# Patient Record
Sex: Female | Born: 1955 | Race: White | Hispanic: No | Marital: Married | Smoking: Never smoker
Health system: Southern US, Community
[De-identification: ages and names within clinical notes are randomized; demographics above are authoritative.]

## PROBLEM LIST (undated history)

## (undated) DIAGNOSIS — C801 Malignant (primary) neoplasm, unspecified: Secondary | ICD-10-CM

## (undated) DIAGNOSIS — Z803 Family history of malignant neoplasm of breast: Secondary | ICD-10-CM

## (undated) DIAGNOSIS — C50919 Malignant neoplasm of unspecified site of unspecified female breast: Secondary | ICD-10-CM

## (undated) DIAGNOSIS — Z853 Personal history of malignant neoplasm of breast: Secondary | ICD-10-CM

## (undated) HISTORY — DX: Malignant (primary) neoplasm, unspecified: C80.1

## (undated) HISTORY — DX: Family history of malignant neoplasm of breast: Z80.3

## (undated) HISTORY — DX: Malignant neoplasm of unspecified site of unspecified female breast: C50.919

## (undated) HISTORY — DX: Personal history of malignant neoplasm of breast: Z85.3

---

## 1992-12-26 DIAGNOSIS — C50919 Malignant neoplasm of unspecified site of unspecified female breast: Secondary | ICD-10-CM

## 1992-12-26 HISTORY — PX: MASTECTOMY, PARTIAL: SHX709

## 1992-12-26 HISTORY — DX: Malignant neoplasm of unspecified site of unspecified female breast: C50.919

## 2006-07-19 ENCOUNTER — Ambulatory Visit: Payer: Self-pay | Admitting: Internal Medicine

## 2006-07-19 ENCOUNTER — Ambulatory Visit (HOSPITAL_COMMUNITY): Admission: RE | Admit: 2006-07-19 | Discharge: 2006-07-19 | Payer: Self-pay | Admitting: Internal Medicine

## 2008-06-17 ENCOUNTER — Ambulatory Visit: Payer: Self-pay | Admitting: Nurse Practitioner

## 2010-06-14 ENCOUNTER — Ambulatory Visit: Payer: Self-pay | Admitting: Nurse Practitioner

## 2010-10-20 ENCOUNTER — Ambulatory Visit: Payer: Self-pay | Admitting: Nurse Practitioner

## 2010-10-21 ENCOUNTER — Ambulatory Visit: Payer: Self-pay | Admitting: General Surgery

## 2010-10-25 LAB — PATHOLOGY REPORT

## 2011-08-09 ENCOUNTER — Emergency Department: Payer: Self-pay | Admitting: Emergency Medicine

## 2012-09-02 IMAGING — US ABDOMEN ULTRASOUND LIMITED
1 series · 17 of 25 positions shown · non-contrast
Comparison: none

REASON FOR EXAM: RUQ Pain Vomiting
COMMENTS:

[Series 1: abdomen ultrasound limited · 17 of 70 slices shown]
[im 1/70]
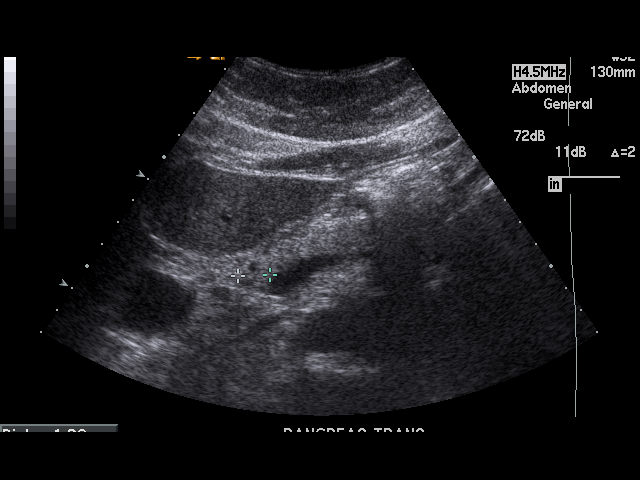
[im 6/70]
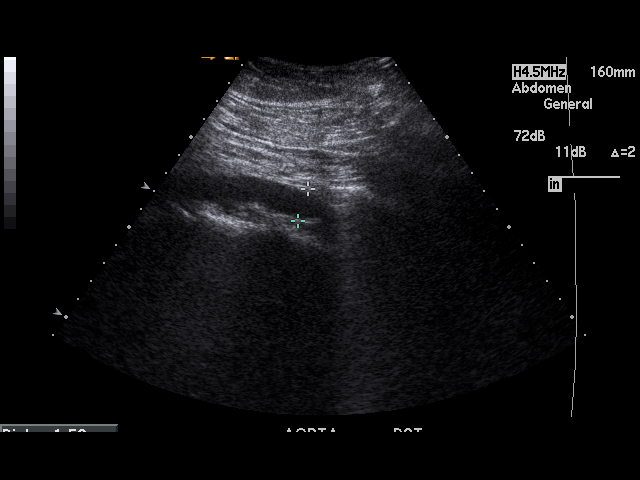
[im 9/70]
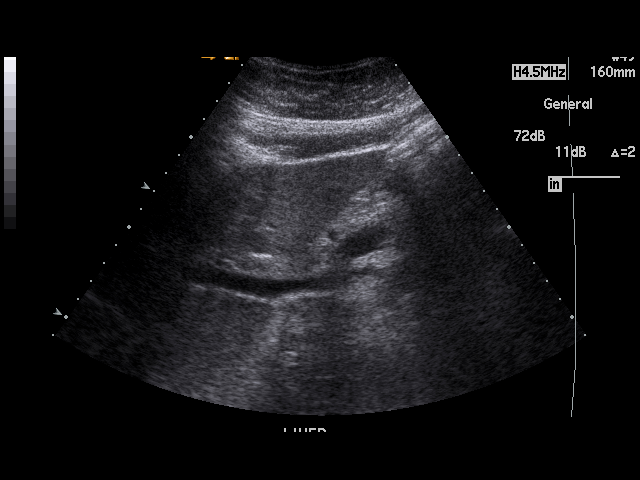
[im 15/70]
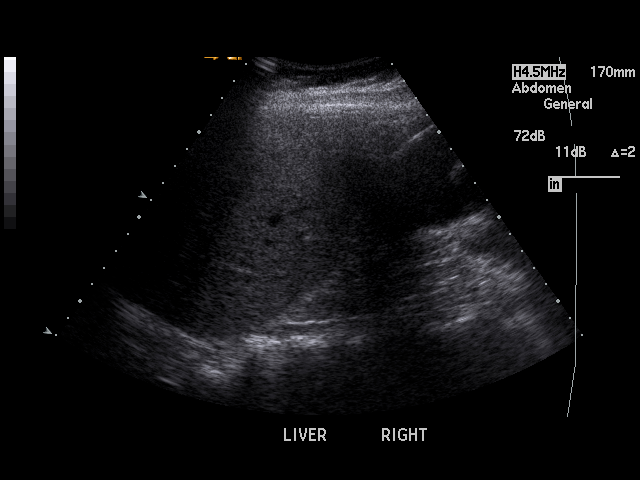
[im 18/70]
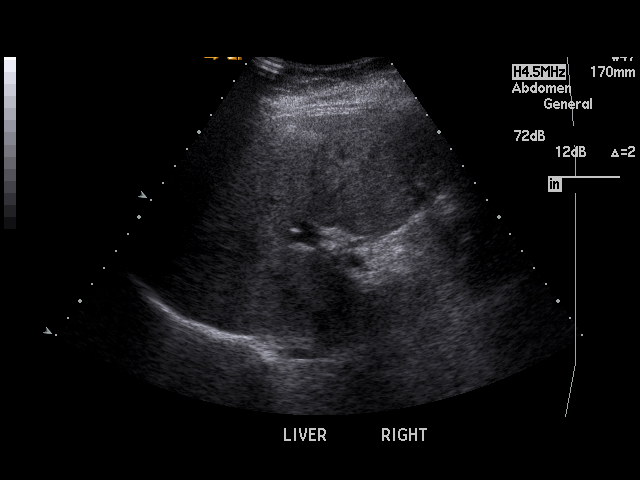
[im 24/70]
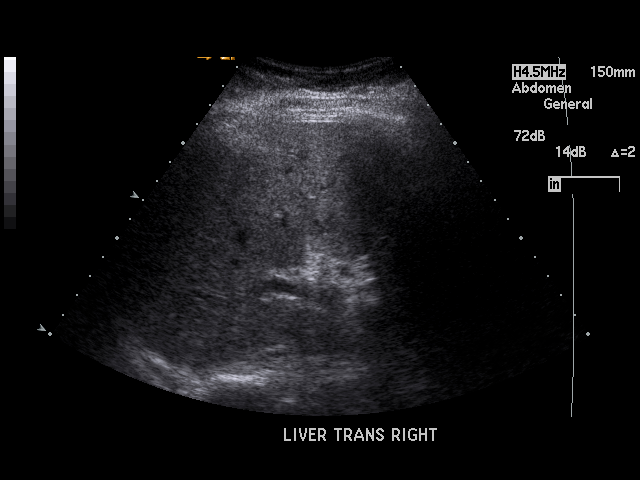
[im 26/70]
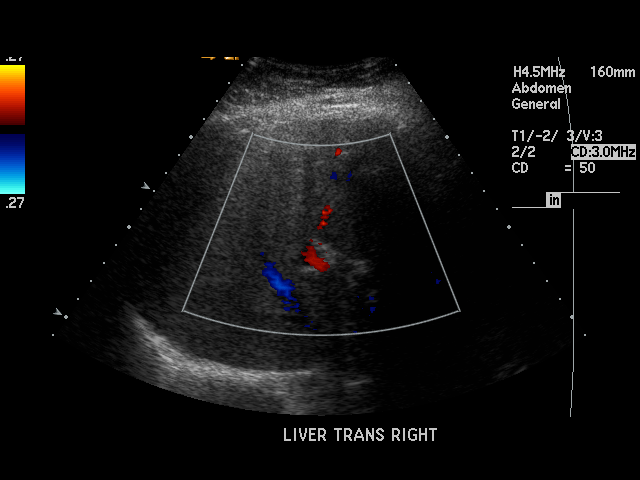
[im 32/70]
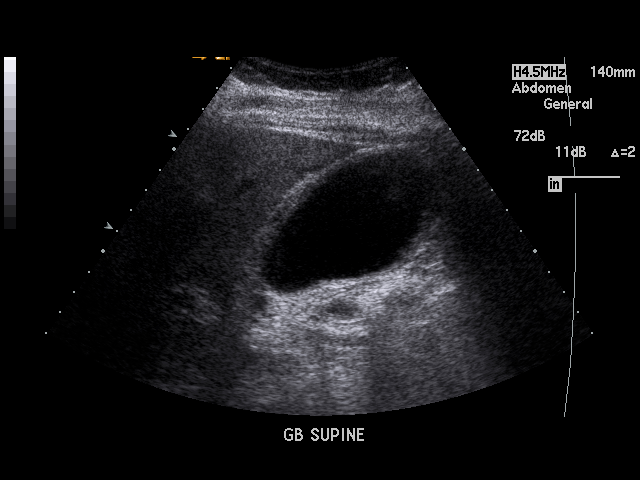
[im 35/70]
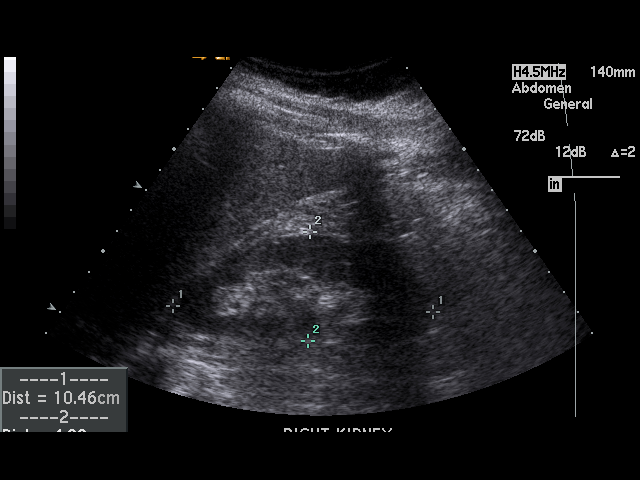
[im 38/70]
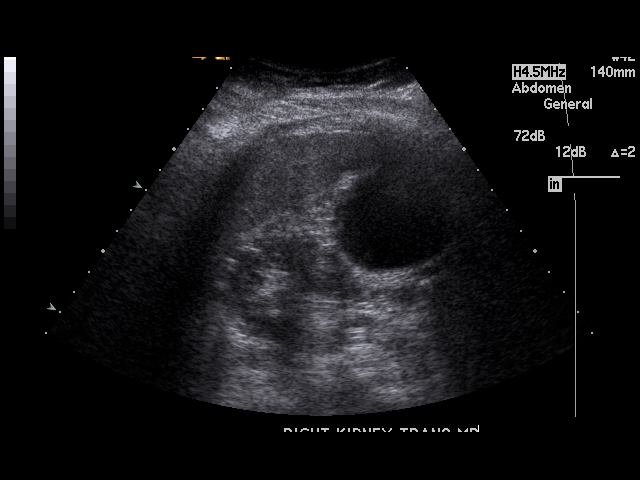
[im 44/70]
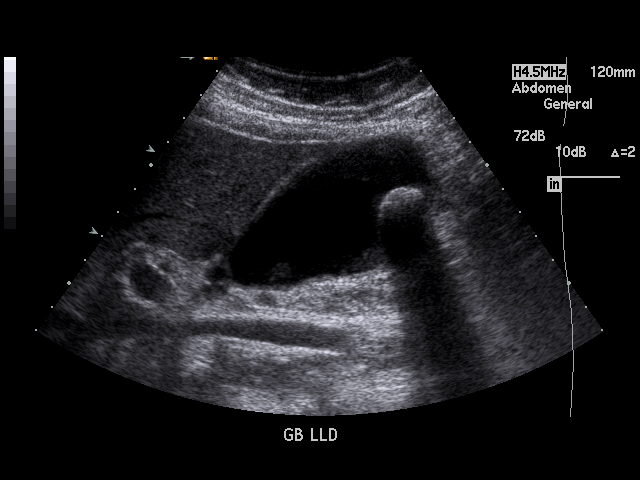
[im 47/70]
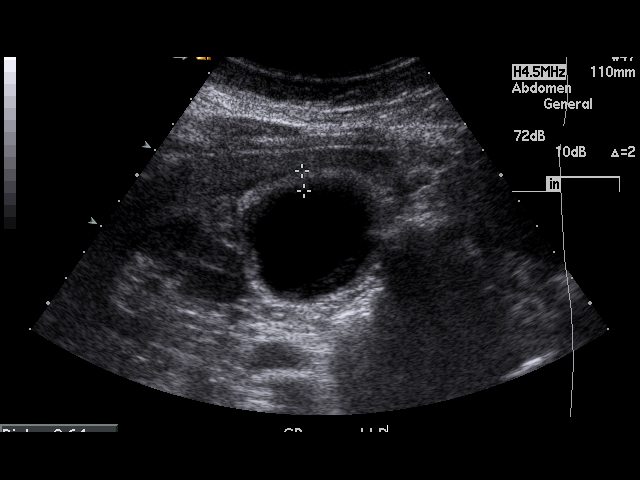
[im 52/70]
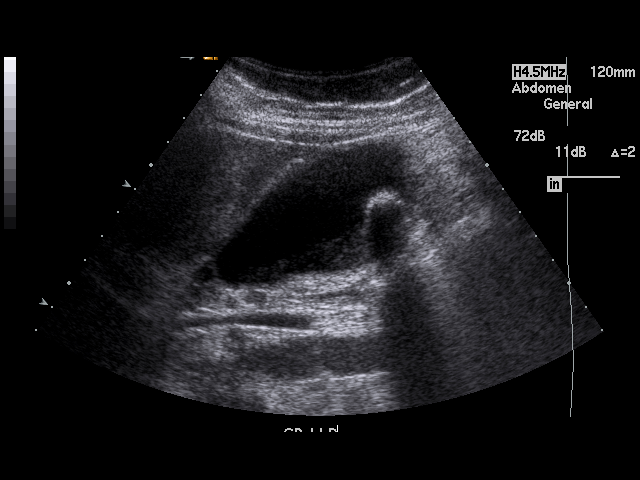
[im 55/70]
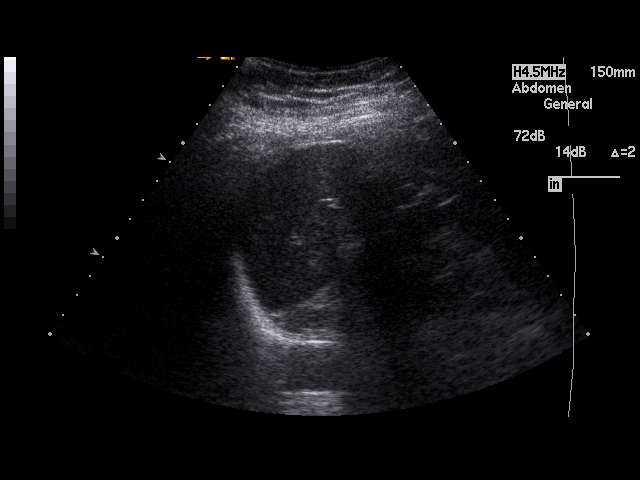
[im 61/70]
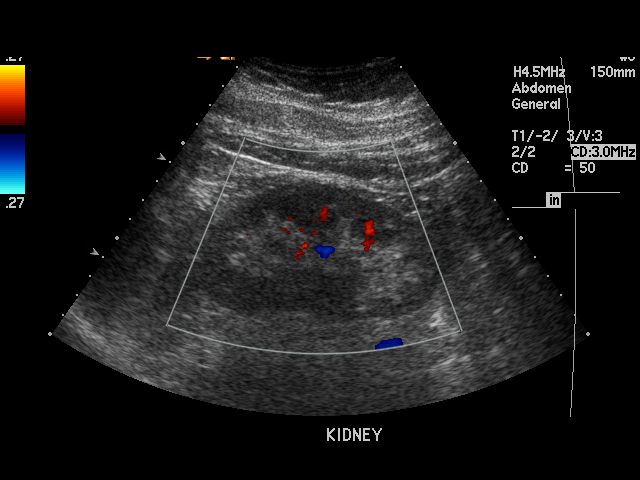
[im 64/70]
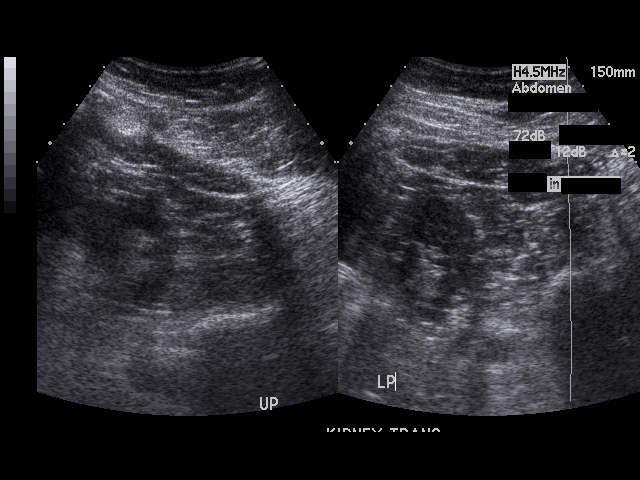
[im 70/70]
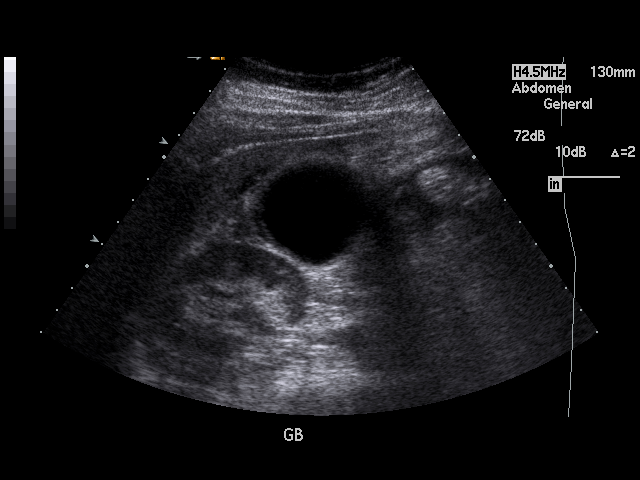

[17 of 25 positions shown; findings below may reference images not displayed]

PROCEDURE:     LASHUN - LASHUN ABDOMEN UPPER GENERAL  - October 20, 2010  [DATE]

RESULT:     The liver, spleen, abdominal aorta and inferior vena cava show
no significant abnormalities. A portion of the pancreatic tail is obscured
by bowel gas but otherwise the pancreas is normal in appearance. There is
observed a 2.2 cm, mobile echodensity in the gallbladder compatible with a
gallstone. Echogenic material compatible with sludge is also observed in the
gallbladder. The gallbladder wall is thickened and is noted to measure
mm in thickness. A small amount of pericholecystic fluid is also seen. These
changes suggest cholecystitis. The common bile duct measures 6.5 mm in
diameter which is upper limits for normal. The kidneys show no
hydronephrosis. No ascites is seen.
IMPRESSION: There is observed cholelithiasis with associated gallbladder
sludge, thickening of the gallbladder wall and pericholecystic fluid. The
findings are suspicious for cholecystitis.

## 2013-06-22 IMAGING — CT CT HEAD WITHOUT CONTRAST
2 series · 16 of 30 positions shown, 20 images · non-contrast
Comparison: none

REASON FOR EXAM: headache
COMMENTS:

[Series 2: without · axial · non-contrast · 0.38mm/px · z∈[+384,+504]mm · 13 of 30 slices shown, 17 images]
[im 3/30  brain]
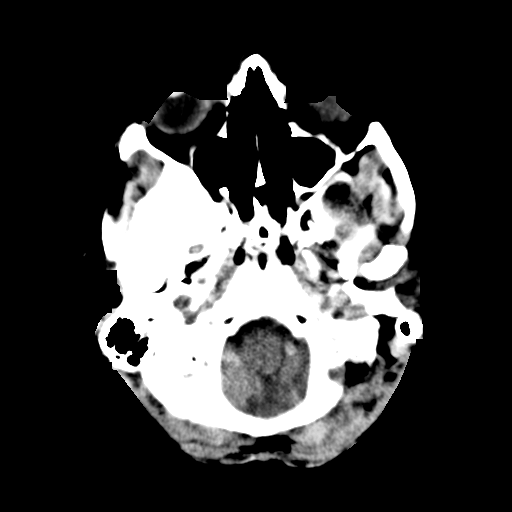
[im 3/30  bone]
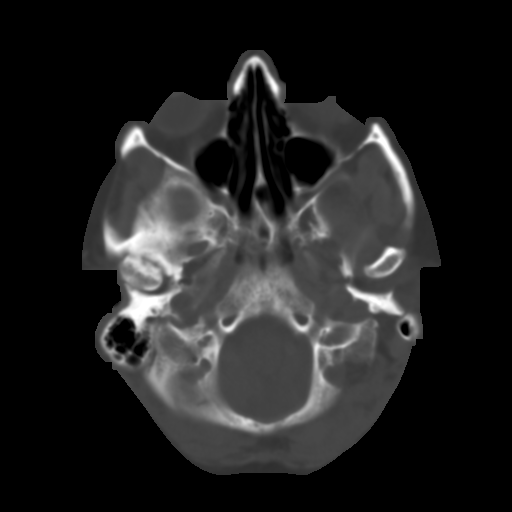
[im 5/30  brain]
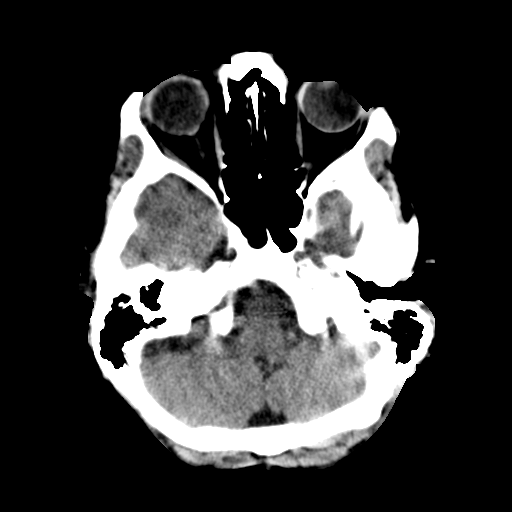
[im 7/30  brain]
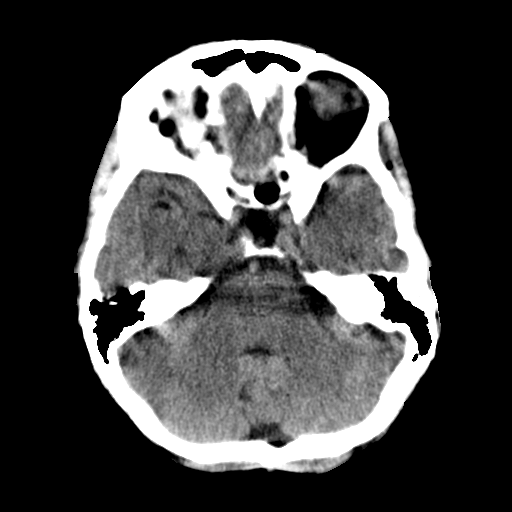
[im 9/30  brain]
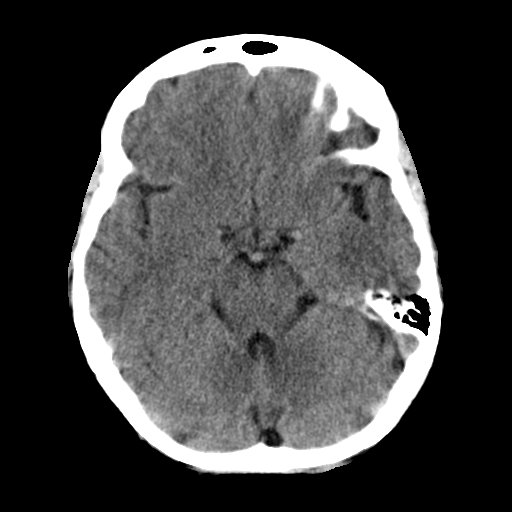
[im 11/30  brain]
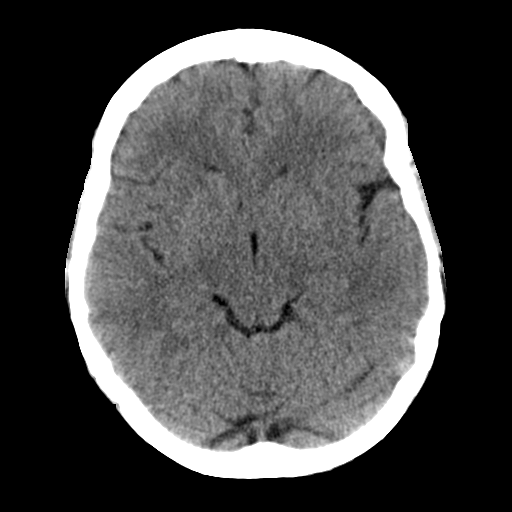
[im 11/30  bone]
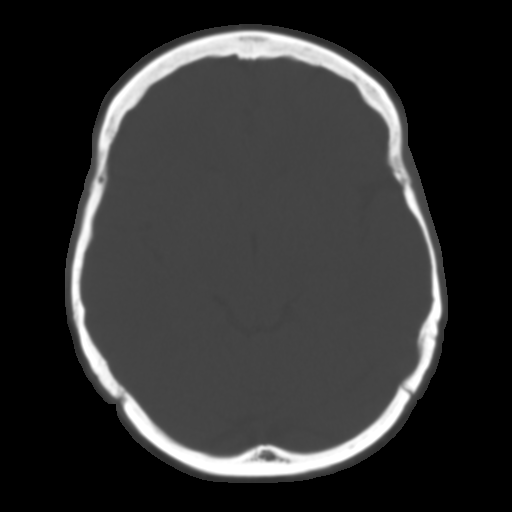
[im 13/30  brain]
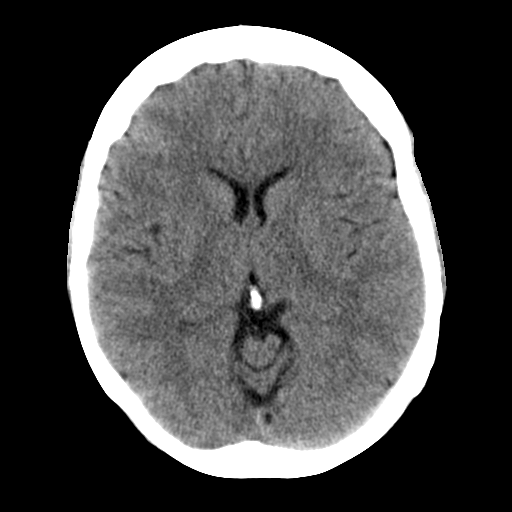
[im 15/30  brain]
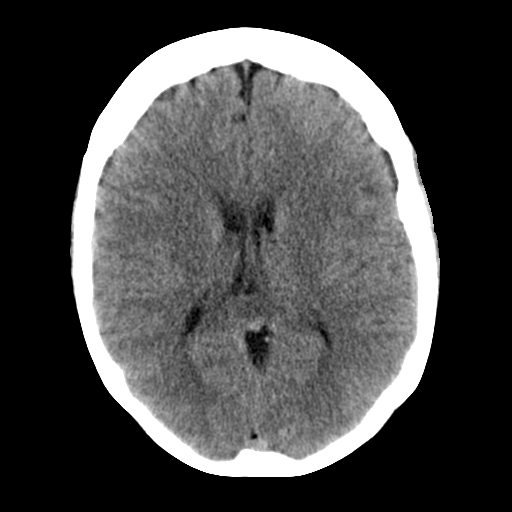
[im 17/30  brain]
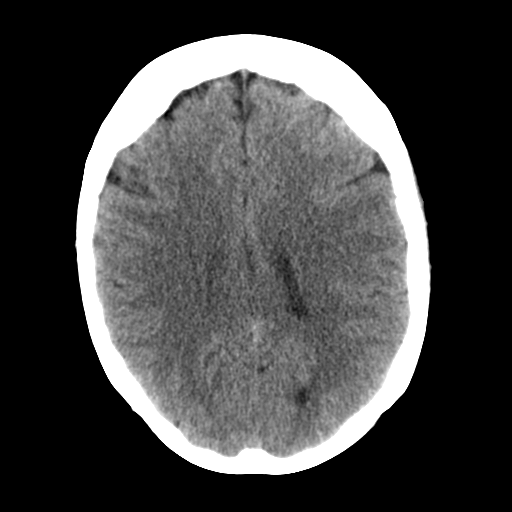
[im 19/30  brain]
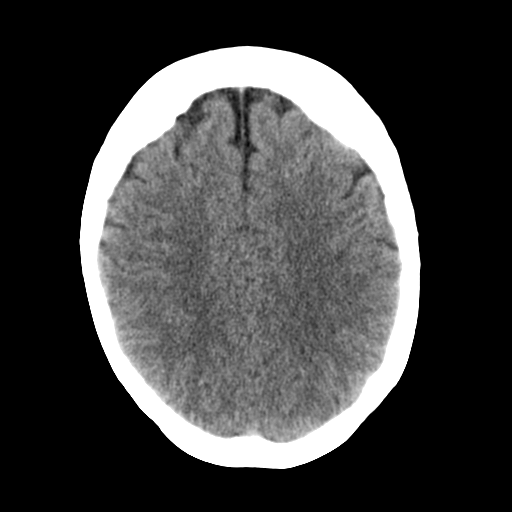
[im 19/30  bone]
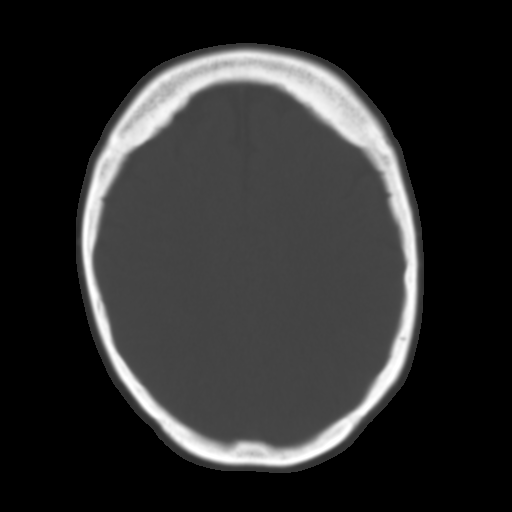
[im 21/30  brain]
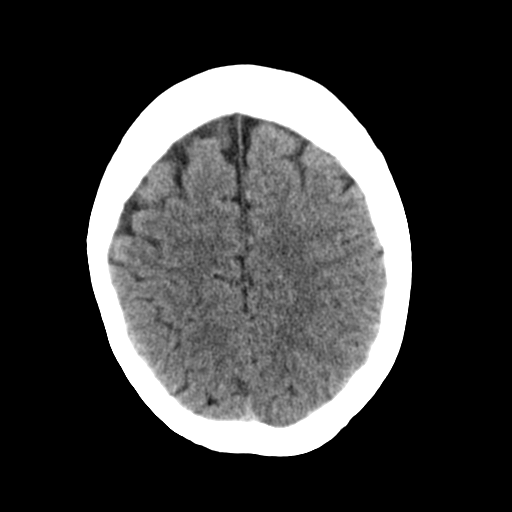
[im 23/30  brain]
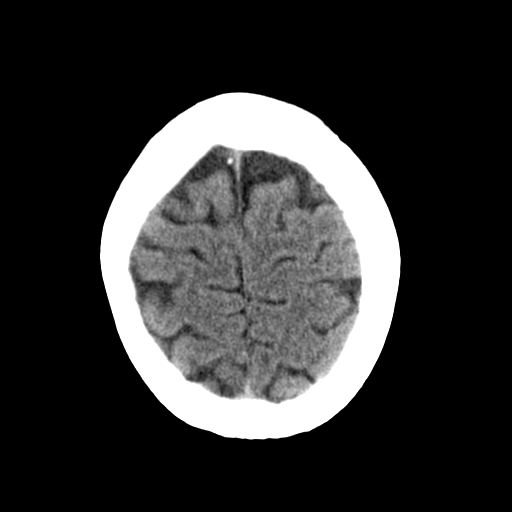
[im 25/30  brain]
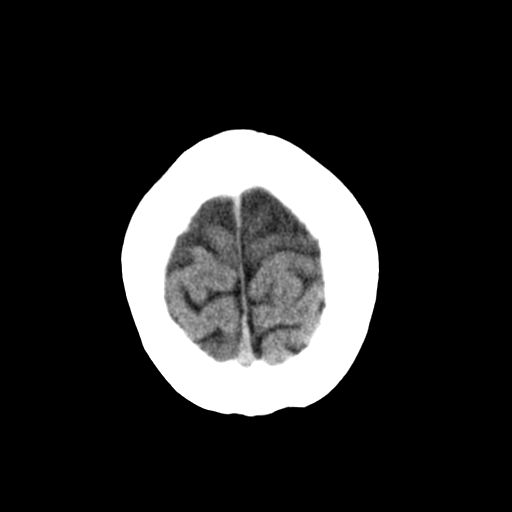
[im 27/30  brain]
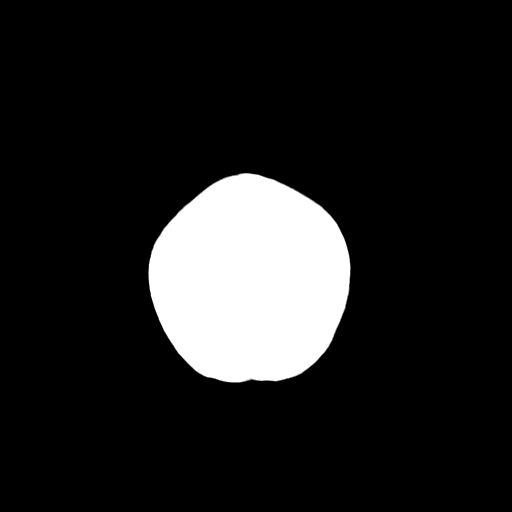
[im 27/30  bone]
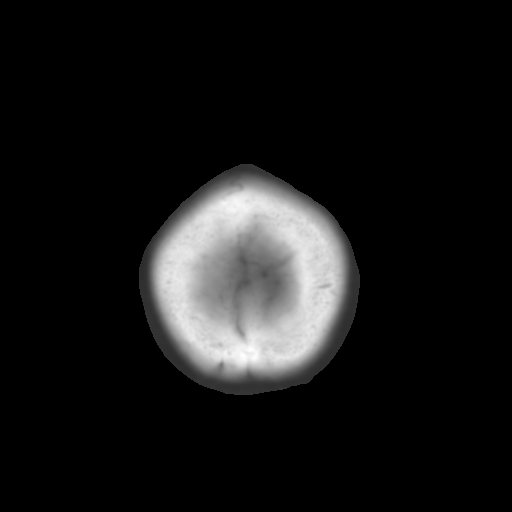

[Series 3: bone · axial · 0.38mm/px · z∈[+384,+424]mm · 3 of 30 slices shown]
[im 3/30  bone]
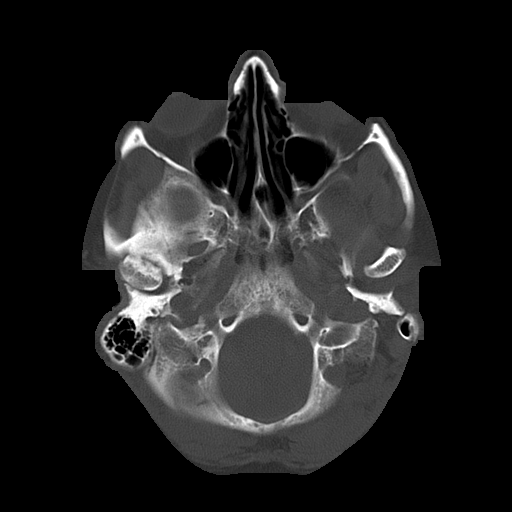
[im 7/30  bone]
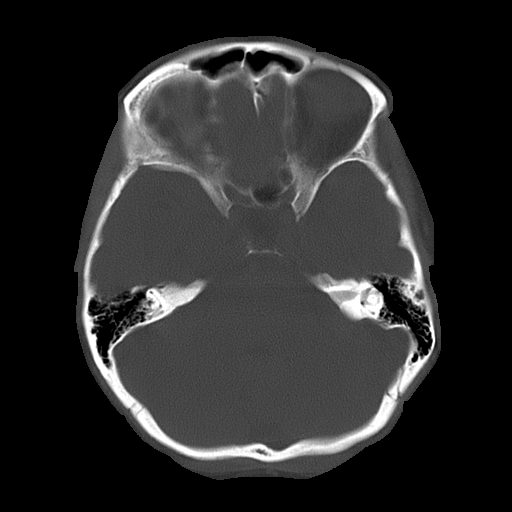
[im 11/30  bone]
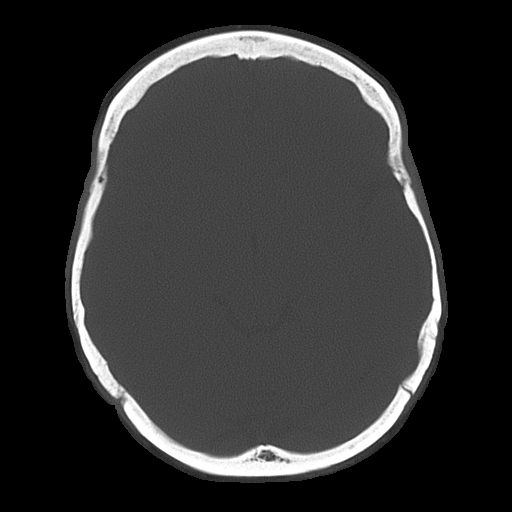

[16 of 30 positions shown; findings below may reference images not displayed]

PROCEDURE:     CT  - CT HEAD WITHOUT CONTRAST  - August 09, 2011 [DATE]

RESULT:     Emergent noncontrast CT of the brain is performed. The patient
has no previous exam for comparison.

The ventricles and sulci are normal. There is no hemorrhage. There is no
focal mass, mass-effect or midline shift. There is no evidence of edema or
territorial infarct. The bone windows demonstrate normal aeration of the
paranasal sinuses and mastoid air cells. There is no skull fracture
demonstrated.
IMPRESSION: 1. No acute intracranial abnormality.

## 2016-06-01 ENCOUNTER — Telehealth: Payer: Self-pay

## 2016-06-01 NOTE — Telephone Encounter (Signed)
RECALL FOR TCS °

## 2016-06-01 NOTE — Telephone Encounter (Signed)
Letter sent to pt

## 2016-08-04 ENCOUNTER — Encounter (HOSPITAL_COMMUNITY): Payer: BC Managed Care – PPO | Attending: Genetic Counselor | Admitting: Genetic Counselor

## 2016-08-04 ENCOUNTER — Encounter (HOSPITAL_COMMUNITY): Payer: Self-pay | Admitting: Genetic Counselor

## 2016-08-04 DIAGNOSIS — Z85850 Personal history of malignant neoplasm of thyroid: Secondary | ICD-10-CM

## 2016-08-04 DIAGNOSIS — Z315 Encounter for genetic counseling: Secondary | ICD-10-CM

## 2016-08-04 DIAGNOSIS — Z853 Personal history of malignant neoplasm of breast: Secondary | ICD-10-CM

## 2016-08-04 DIAGNOSIS — Z803 Family history of malignant neoplasm of breast: Secondary | ICD-10-CM | POA: Diagnosis not present

## 2016-08-04 DIAGNOSIS — C50912 Malignant neoplasm of unspecified site of left female breast: Secondary | ICD-10-CM

## 2016-08-04 DIAGNOSIS — C801 Malignant (primary) neoplasm, unspecified: Secondary | ICD-10-CM | POA: Insufficient documentation

## 2016-08-04 NOTE — Progress Notes (Signed)
REFERRING PROVIDER: Izola Price, MD  Kurtis Bushman, MD  PRIMARY PROVIDER:  No primary care provider on file.  PRIMARY REASON FOR VISIT:  1. Malignant neoplasm of left female breast, unspecified site of breast ([facility])   2. History of breast cancer   3. Family history of breast cancer   4. Cancer [facility])      HISTORY OF PRESENT ILLNESS:   Christine Peck, a 60 y.o. female, was seen for a [facility] cancer genetics consultation at the request of Dr. Lunette Stands due to a personal and family history of cancer.  Christine Peck presents to clinic today to discuss the possibility of a hereditary predisposition to cancer, genetic testing, and to further clarify her future cancer risks, as well as potential cancer risks for family members.   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 This was treated with chemotherapy and a partial mastectomy.  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  Her daughter was seen in our clinic in July enquiring about genetic testing.  We discussed that testing individuals who have had cancer is more informative then testing unaffected individuals.  Therefore testing Christine Peck would be most informative.      CANCER HISTORY:   No history exists.     HORMONAL RISK FACTORS:  Menarche was at age 60.  First live birth at age 19.  OCP use for approximately <1 years.  Ovaries intact: yes.  Hysterectomy: no.  Menopausal status: postmenopausal.  HRT use: 0 years. Colonoscopy: yes; normal. Mammogram within the last year: yes. Number of breast biopsies: 1. Up to date with pelvic exams:  yes. Any excessive radiation exposure in the past:  no  Past Medical History:  Diagnosis Date  . Breast cancer ([facility]) 1994   ER-/PR- breast cancer  . Cancer ([facility])    papillary thyroid cancer  . Family history of breast cancer   . History of breast cancer     Past Surgical History:  Procedure Laterality Date  .  MASTECTOMY, PARTIAL Left 1994    Social History   Social History  . Marital status: Married    Spouse name: N/A  . Number of children: N/A  . Years of education: N/A   Social History Main Topics  . Smoking status: Never Smoker  . Smokeless tobacco: Never Used  . Alcohol use No  . Drug use: No  . Sexual activity: Not Asked   Other Topics Concern  . None   Social History Narrative  . None     FAMILY HISTORY:  We obtained a detailed, 4-generation family history.  Significant diagnoses are listed below: Family History  Problem Relation Age of Onset  . ALS Mother   . Heart failure Father   . Bone cancer Maternal Aunt 78  . Breast cancer Paternal Aunt     dx >50  . Breast cancer Sister 66  . Colon cancer Paternal Aunt     The patient has two daughters who are cancer free.  She has two sisters, one who had breast cancer at age 28.  The patient's parents are both deceased.  Her mother died of ALS.  Her mother had three brothers and a sister.  The sister had bone cancer at 33.  The patient's father died of heart failure.  He had four sisters and five brothers.  One sister had breast cancer over 49 and another sister had colon cancer.  There is no other reported family history of cancer.  Patient's maternal ancestors are of Caucasian descent, and paternal ancestors are of Caucasian descent. There is no reported Ashkenazi Jewish ancestry. There is no known consanguinity.  GENETIC COUNSELING ASSESSMENT: Christine Peck is a 60 y.o. female with a personal and family history of cancer which is somewhat suggestive of a hereditary cancer syndrome and predisposition to cancer. We, therefore, discussed and recommended the following at today's visit.   DISCUSSION: We discussed that about 5-10% of breast cancer is due to hereditary causes, most commonly BRCA mutations.  There are other genes that can also be associated with breast cancer including PALB2, ATM and CHEK2.  We reviewed the  characteristics, features and inheritance patterns of hereditary cancer syndromes. We also discussed genetic testing, including the appropriate family members to test, the process of testing, insurance coverage and turn-around-time for results. We discussed the implications of a negative, positive and/or variant of uncertain significant result. We recommended Christine Peck pursue genetic testing for the Breast/Ovarian cancer gene panel.   Based on Christine Peck's personal and family history of cancer, she meets medical criteria for genetic testing. Despite that she meets criteria, she may still have an out of pocket cost. We discussed that if her out of pocket cost for testing is over $100, the laboratory will call and confirm whether she wants to proceed with testing.  If the out of pocket cost of testing is less than $100 she will be billed by the genetic testing laboratory.   PLAN: After considering the risks, benefits, and limitations, Christine Peck  provided informed consent to pursue genetic testing and the blood sample was sent to [facility] for analysis of the Breast/Ovarian cancer panel. Results should be available within approximately 2-3 weeks' time, at which point they will be disclosed by telephone to Christine Peck, as will any additional recommendations warranted by these results. Christine Peck will receive a summary of her genetic counseling visit and a copy of her results once available. This information will also be available in Epic. We encouraged Christine Peck to remain in contact with cancer genetics annually so that we can continuously update the family history and inform her of any changes in cancer genetics and testing that may be of benefit for her family. Christine Peck questions were answered to her satisfaction today. Our contact information was provided should additional questions or concerns arise.  Lastly, we encouraged Christine Peck to remain in contact with cancer genetics annually so that we can  continuously update the family history and inform her of any changes in cancer genetics and testing that may be of benefit for this family.   Ms.  Peck questions were answered to her satisfaction today. Our contact information was provided should additional questions or concerns arise. Thank you for the referral and allowing [place] to share in the care of your patient.   Naoko Diperna P. Florene Glen, [place], [facility] Certified Genetic Counselor [email or web address] phone: [phone]  The patient was seen for a total of 45 minutes in face-to-face genetic counseling.  This patient was discussed with Drs. Magrinat, Lindi Adie and/or Burr Medico who agrees with the above.    _______________________________________________________________________ For Office Staff:  Number of people involved in session: 2 Was an Intern/ student involved with case: no

## 2016-08-19 ENCOUNTER — Telehealth: Payer: Self-pay | Admitting: Genetic Counselor

## 2016-08-19 ENCOUNTER — Encounter: Payer: Self-pay | Admitting: Genetic Counselor

## 2016-08-19 DIAGNOSIS — Z1379 Encounter for other screening for genetic and chromosomal anomalies: Secondary | ICD-10-CM | POA: Insufficient documentation

## 2016-08-19 NOTE — Telephone Encounter (Signed)
Revealed negative genetic testing.  Discussed that based on this test we do not feel that her daughters need to undergo genetic testing.  This does not tell us why Christine Peck's had breast cancer or why there is cancer in her family. There is a chance that our technology is not able to pick up certain mutations at this time, or that there are other genes that were not on this test that could be implicated.  Explained that we would recommend that her sister, Christine Peck, undergo genetic testing to determine if there is a gene that Christine Peck did not inherit that caused cancer in the family.  However, Christine Peck is the youngest of the ages of onset, and we would expect that if anyone would test positive it would have been her.  Christine Peck discussed that her sister Christine Peck has dementia so she is unsure if Christine Peck's husband would agree to having her tested.

## 2016-08-23 ENCOUNTER — Encounter: Payer: Self-pay | Admitting: Genetic Counselor

## 2016-08-23 ENCOUNTER — Ambulatory Visit: Payer: Self-pay | Admitting: Genetic Counselor

## 2016-08-23 DIAGNOSIS — Z853 Personal history of malignant neoplasm of breast: Secondary | ICD-10-CM

## 2016-08-23 DIAGNOSIS — Z803 Family history of malignant neoplasm of breast: Secondary | ICD-10-CM

## 2016-08-23 DIAGNOSIS — Z1379 Encounter for other screening for genetic and chromosomal anomalies: Secondary | ICD-10-CM

## 2016-08-23 NOTE — Progress Notes (Signed)
HPI: Ms. Maietta was previously seen in the [place] clinic due to a personal history of triple negative breast cancer, family history of breast and colon cancer and concerns regarding a hereditary predisposition to cancer. Please refer to our prior cancer genetics clinic note for more information regarding Ms. Parisien's medical, social and family histories, and our assessment and recommendations, at the time. Ms. Mehl recent genetic test results were disclosed to her, as were recommendations warranted by these results. These results and recommendations are discussed in more detail below.  FAMILY HISTORY:  We obtained a detailed, 4-generation family history.  Significant diagnoses are listed below: Family History  Problem Relation Age of Onset  . ALS Mother   . Heart failure Father   . Bone cancer Maternal Aunt 78  . Breast cancer Paternal Aunt     dx >50  . Breast cancer Sister 88  . Colon cancer Paternal Aunt     The patient has two daughters who are cancer free.  She has two sisters, one who had breast cancer at age 18.  The patient's parents are both deceased.  Her mother died of ALS.  Her mother had three brothers and a sister.  The sister had bone cancer at 56.  The patient's father died of heart failure.  He had four sisters and five brothers.  One sister had breast cancer over 66 and another sister had colon cancer.  There is no other reported family history of cancer.  Patient's maternal ancestors are of Caucasian descent, and paternal ancestors are of Caucasian descent. There is no reported Ashkenazi Jewish ancestry. There is no known consanguinity.  GENETIC TEST RESULTS: At the time of Ms. Phommachanh's visit, we recommended she pursue genetic testing of the Breast/Ovarian cancer gene panel. The Breast/Ovarian gene panel offered by [facility] includes sequencing and rearrangement analysis for the following 20 genes:  ATM, BARD1, BRCA1, BRCA2, BRIP1, CDH1, CHEK2, EPCAM, FANCC, MLH1,  MSH2, MSH6, NBN, PALB2, PMS2, PTEN, RAD51C, RAD51D, TP53, and XRCC2.   The report date is August 18, 2016.  Genetic testing was normal, and did not reveal a deleterious mutation in these genes. The test report has been scanned into EPIC and is located under the Molecular Pathology section of the Results Review tab.   We discussed with Ms. Hollett that since the current genetic testing is not perfect, it is possible there may be a gene mutation in one of these genes that current testing cannot detect, but that chance is small. We also discussed, that it is possible that another gene that has not yet been discovered, or that we have not yet tested, is responsible for the cancer diagnoses in the family, and it is, therefore, important to remain in touch with cancer genetics in the future so that we can continue to offer Ms. Lindo the most up to date genetic testing.   CANCER SCREENING RECOMMENDATIONS: Given Ms. Drolet's personal and family histories, we must interpret these negative results with some caution.  Families with features suggestive of hereditary risk for cancer tend to have multiple family members with cancer, diagnoses in multiple generations and diagnoses before the age of 21. Ms. Winterton's family exhibits some of these features. Thus this result may simply reflect our current inability to detect all mutations within these genes or there may be a different gene that has not yet been discovered or tested.   RECOMMENDATIONS FOR FAMILY MEMBERS: Women in this family might be at some increased risk of  developing cancer, over the general population risk, simply due to the family history of cancer. We recommended women in this family have a yearly mammogram beginning at age 79, or 55 years younger than the earliest onset of cancer, and an annual clinical breast exam, and perform monthly breast self-exams. Therefore, Ms. Stailey's daughters should start undergoing mammograms at age 26. Women in this family should  also have a gynecological exam as recommended by their primary provider. All family members should have a colonoscopy by age 5.  Based on Ms. Craun's family history, we recommended her sister, Lelon Frohlich, who was diagnosed with breast cancer at age 56, have genetic counseling and testing.  Per Ms. Welle, this would be difficult as her sister is suffering with dementia.  Ms. Ferrick will let us know if we can be of any assistance in coordinating genetic counseling and/or testing for this family member.   FOLLOW-UP: Lastly, we discussed with Ms. Wollin that cancer genetics is a rapidly advancing field and it is possible that new genetic tests will be appropriate for her and/or her family members in the future. We encouraged her to remain in contact with cancer genetics on an annual basis so we can update her personal and family histories and let her know of advances in cancer genetics that may benefit this family.   Our contact number was provided. Ms. Settlemyre questions were answered to her satisfaction, and she knows she is welcome to call us at anytime with additional questions or concerns.   Roma Kayser, MS, [facility] Certified Genetic Counselor [email or web address]

## 2017-04-07 ENCOUNTER — Encounter (HOSPITAL_COMMUNITY): Payer: Self-pay
